# Patient Record
Sex: Female | Born: 1975 | Hispanic: Yes | Marital: Married | Smoking: Never smoker
Health system: Southern US, Community
[De-identification: ages and names within clinical notes are randomized; demographics above are authoritative.]

## PROBLEM LIST (undated history)

## (undated) DIAGNOSIS — E119 Type 2 diabetes mellitus without complications: Secondary | ICD-10-CM

## (undated) DIAGNOSIS — O09529 Supervision of elderly multigravida, unspecified trimester: Secondary | ICD-10-CM

## (undated) HISTORY — PX: NO PAST SURGERIES: SHX2092

---

## 2004-11-22 DIAGNOSIS — E119 Type 2 diabetes mellitus without complications: Secondary | ICD-10-CM

## 2004-11-22 HISTORY — DX: Type 2 diabetes mellitus without complications: E11.9

## 2015-11-23 DIAGNOSIS — O09529 Supervision of elderly multigravida, unspecified trimester: Secondary | ICD-10-CM

## 2015-11-23 HISTORY — DX: Supervision of elderly multigravida, unspecified trimester: O09.529

## 2015-11-23 NOTE — L&D Delivery Note (Signed)
Delivery Note EGA: 39.0 Patient's last menstrual period was 01/25/2016. EDC: 10/29/16  At 7:58 AM a viable female was delivered via Vaginal, Spontaneous Delivery (Presentation: cephalic; LOA).  APGAR: 8, 9; weight 7 lb 11.8 oz (3510 g).   Placenta status: sponatneous, intact.  Cord: 3 vessels, with the following complications: none apparent .  Cord pH: not collected  Anesthesia:  none Episiotomy: None Lacerations: None Suture Repair: n/a Est. Blood Loss (mL):  300cc  Mom to postpartum.  Baby to Couplet care / Skin to Skin.   Mom presented in labor and progressed from 2cm to 4cm.  She then rapidly progressed to complete with a bulging bag.  Amniotomy was performed for clear fluid; a short second stage with excellent maternal effort.  FEtal head was delivered then followed by shoulders and abdomen without difficulty.  Baby was then placed on mom's chest and attended to by pediatric team. After a delay until cord was pulseless (>60sec), FOB cut cord after doubly clamped. Placenta was delivered intact, and IV pitocin was started [facility].  No lacerations.  We sang happy birthday to baby Cammy Copabigail.    Mom signed consents for a PPTL and this will be scheduled later today.  Nas Wafer C Kaysin Brock 10/24/2016, 8:36 AM

## 2016-04-26 ENCOUNTER — Other Ambulatory Visit: Payer: Self-pay | Admitting: Primary Care

## 2016-04-26 DIAGNOSIS — O24419 Gestational diabetes mellitus in pregnancy, unspecified control: Secondary | ICD-10-CM

## 2016-06-14 ENCOUNTER — Ambulatory Visit
Admission: RE | Admit: 2016-06-14 | Discharge: 2016-06-14 | Disposition: A | Discharge status: Home / Self Care | Payer: Medicaid Other | Source: Ambulatory Visit | Attending: Obstetrics and Gynecology | Admitting: Obstetrics and Gynecology

## 2016-06-14 DIAGNOSIS — Z3A20 20 weeks gestation of pregnancy: Secondary | ICD-10-CM | POA: Insufficient documentation

## 2016-06-14 DIAGNOSIS — O24419 Gestational diabetes mellitus in pregnancy, unspecified control: Secondary | ICD-10-CM | POA: Diagnosis present

## 2016-06-14 DIAGNOSIS — O09522 Supervision of elderly multigravida, second trimester: Secondary | ICD-10-CM | POA: Diagnosis not present

## 2016-06-14 HISTORY — DX: Type 2 diabetes mellitus without complications: E11.9

## 2016-06-14 HISTORY — DX: Supervision of elderly multigravida, unspecified trimester: O09.529

## 2016-10-24 ENCOUNTER — Inpatient Hospital Stay: Payer: Medicaid Other | Admitting: Anesthesiology

## 2016-10-24 ENCOUNTER — Encounter
Admission: EM | Disposition: A | Discharge status: Home / Self Care | Payer: Self-pay | Source: Home / Self Care | Attending: Obstetrics & Gynecology

## 2016-10-24 ENCOUNTER — Inpatient Hospital Stay
Admission: EM | Admit: 2016-10-24 | Discharge: 2016-10-26 | DRG: 767 | Disposition: A | Discharge status: Home / Self Care | Payer: Medicaid Other | Attending: Obstetrics & Gynecology | Admitting: Obstetrics & Gynecology

## 2016-10-24 DIAGNOSIS — O99824 Streptococcus B carrier state complicating childbirth: Secondary | ICD-10-CM | POA: Diagnosis present

## 2016-10-24 DIAGNOSIS — Z3493 Encounter for supervision of normal pregnancy, unspecified, third trimester: Secondary | ICD-10-CM | POA: Diagnosis present

## 2016-10-24 DIAGNOSIS — E669 Obesity, unspecified: Secondary | ICD-10-CM | POA: Diagnosis present

## 2016-10-24 DIAGNOSIS — Z302 Encounter for sterilization: Secondary | ICD-10-CM

## 2016-10-24 DIAGNOSIS — O99214 Obesity complicating childbirth: Principal | ICD-10-CM | POA: Diagnosis present

## 2016-10-24 DIAGNOSIS — Z6831 Body mass index (BMI) 31.0-31.9, adult: Secondary | ICD-10-CM | POA: Diagnosis not present

## 2016-10-24 DIAGNOSIS — Z3A39 39 weeks gestation of pregnancy: Secondary | ICD-10-CM

## 2016-10-24 DIAGNOSIS — Z3490 Encounter for supervision of normal pregnancy, unspecified, unspecified trimester: Secondary | ICD-10-CM

## 2016-10-24 DIAGNOSIS — O09529 Supervision of elderly multigravida, unspecified trimester: Secondary | ICD-10-CM

## 2016-10-24 HISTORY — PX: TUBAL LIGATION: SHX77

## 2016-10-24 LAB — CBC
HEMATOCRIT: 35.4 % (ref 35.0–47.0)
HEMOGLOBIN: 12.4 g/dL (ref 12.0–16.0)
MCH: 32.2 pg (ref 26.0–34.0)
MCHC: 35.1 g/dL (ref 32.0–36.0)
MCV: 91.7 fL (ref 80.0–100.0)
Platelets: 218 10*3/uL (ref 150–440)
RBC: 3.87 MIL/uL (ref 3.80–5.20)
RDW: 14.2 % (ref 11.5–14.5)
WBC: 13.3 10*3/uL — AB (ref 3.6–11.0)

## 2016-10-24 LAB — TYPE AND SCREEN
ABO/RH(D): A POS
ANTIBODY SCREEN: NEGATIVE

## 2016-10-24 SURGERY — LIGATION, FALLOPIAN TUBE, POSTPARTUM
Anesthesia: General | Laterality: Bilateral

## 2016-10-24 MED ORDER — PENICILLIN G POTASSIUM 5000000 UNITS IJ SOLR
5.0000 10*6.[IU] | Freq: Once | INTRAVENOUS | Status: AC
  Administered 2016-10-24: 5 10*6.[IU] via INTRAVENOUS
  Filled 2016-10-24: qty 5

## 2016-10-24 MED ORDER — ACETAMINOPHEN 325 MG PO TABS: 650.0000 mg | ORAL_TABLET | ORAL | Status: DC | PRN

## 2016-10-24 MED ORDER — ONDANSETRON HCL 4 MG/2ML IJ SOLN
INTRAMUSCULAR | Status: DC | PRN
  Administered 2016-10-24: 4 mg via INTRAVENOUS

## 2016-10-24 MED ORDER — SOD CITRATE-CITRIC ACID 500-334 MG/5ML PO SOLN: 30.0000 mL | ORAL | Status: DC | PRN

## 2016-10-24 MED ORDER — WITCH HAZEL-GLYCERIN EX PADS: 1.0000 "application " | MEDICATED_PAD | CUTANEOUS | Status: DC | PRN

## 2016-10-24 MED ORDER — PENICILLIN G POT IN DEXTROSE 60000 UNIT/ML IV SOLN
3.0000 10*6.[IU] | INTRAVENOUS | Status: DC
  Filled 2016-10-24 (×11): qty 50

## 2016-10-24 MED ORDER — OXYTOCIN 10 UNIT/ML IJ SOLN
INTRAMUSCULAR | Status: AC
  Filled 2016-10-24: qty 2

## 2016-10-24 MED ORDER — ACETAMINOPHEN 500 MG PO TABS
1000.0000 mg | ORAL_TABLET | Freq: Four times a day (QID) | ORAL | Status: DC | PRN
  Administered 2016-10-25: 1000 mg via ORAL
  Filled 2016-10-24: qty 2

## 2016-10-24 MED ORDER — OXYCODONE-ACETAMINOPHEN 5-325 MG PO TABS: 2.0000 | ORAL_TABLET | ORAL | Status: DC | PRN

## 2016-10-24 MED ORDER — PROPOFOL 10 MG/ML IV BOLUS
INTRAVENOUS | Status: DC | PRN
  Administered 2016-10-24: 120 mg via INTRAVENOUS

## 2016-10-24 MED ORDER — METOCLOPRAMIDE HCL 10 MG PO TABS
10.0000 mg | ORAL_TABLET | Freq: Once | ORAL | Status: AC
  Administered 2016-10-24: 10 mg via ORAL
  Filled 2016-10-24: qty 1

## 2016-10-24 MED ORDER — FLEET ENEMA 7-19 GM/118ML RE ENEM: 1.0000 | ENEMA | RECTAL | Status: DC | PRN

## 2016-10-24 MED ORDER — SUCCINYLCHOLINE CHLORIDE 20 MG/ML IJ SOLN
INTRAMUSCULAR | Status: DC | PRN
  Administered 2016-10-24: 80 mg via INTRAVENOUS

## 2016-10-24 MED ORDER — FENTANYL CITRATE (PF) 100 MCG/2ML IJ SOLN
INTRAMUSCULAR | Status: DC | PRN
  Administered 2016-10-24: 50 ug via INTRAVENOUS

## 2016-10-24 MED ORDER — LACTATED RINGERS IV SOLN
INTRAVENOUS | Status: DC
  Administered 2016-10-24: 07:00:00 via INTRAVENOUS

## 2016-10-24 MED ORDER — LIDOCAINE HCL (CARDIAC) 20 MG/ML IV SOLN
INTRAVENOUS | Status: DC | PRN
  Administered 2016-10-24 (×2): 50 mg via INTRAVENOUS

## 2016-10-24 MED ORDER — MISOPROSTOL 200 MCG PO TABS
ORAL_TABLET | ORAL | Status: AC
  Filled 2016-10-24: qty 4

## 2016-10-24 MED ORDER — OXYTOCIN 40 UNITS IN LACTATED RINGERS INFUSION - SIMPLE MED
2.5000 [IU]/h | INTRAVENOUS | Status: DC
  Filled 2016-10-24: qty 1000

## 2016-10-24 MED ORDER — AMMONIA AROMATIC IN INHA
RESPIRATORY_TRACT | Status: AC
  Filled 2016-10-24: qty 10

## 2016-10-24 MED ORDER — TETANUS-DIPHTH-ACELL PERTUSSIS 5-2.5-18.5 LF-MCG/0.5 IM SUSP
0.5000 mL | Freq: Once | INTRAMUSCULAR | Status: DC
  Filled 2016-10-24: qty 0.5

## 2016-10-24 MED ORDER — FAMOTIDINE 20 MG PO TABS
40.0000 mg | ORAL_TABLET | Freq: Once | ORAL | Status: AC
  Administered 2016-10-24: 40 mg via ORAL
  Filled 2016-10-24: qty 2

## 2016-10-24 MED ORDER — BUTORPHANOL TARTRATE 1 MG/ML IJ SOLN
1.0000 mg | INTRAMUSCULAR | Status: DC | PRN
  Filled 2016-10-24: qty 1

## 2016-10-24 MED ORDER — ONDANSETRON HCL 4 MG/2ML IJ SOLN
4.0000 mg | Freq: Four times a day (QID) | INTRAMUSCULAR | Status: DC | PRN
  Administered 2016-10-24: 4 mg via INTRAVENOUS
  Filled 2016-10-24: qty 2

## 2016-10-24 MED ORDER — LIDOCAINE HCL (PF) 1 % IJ SOLN: 30.0000 mL | INTRAMUSCULAR | Status: DC | PRN

## 2016-10-24 MED ORDER — FENTANYL CITRATE (PF) 100 MCG/2ML IJ SOLN
25.0000 ug | INTRAMUSCULAR | Status: DC | PRN
  Administered 2016-10-24 (×2): 25 ug via INTRAVENOUS

## 2016-10-24 MED ORDER — BUPIVACAINE HCL 0.5 % IJ SOLN
INTRAMUSCULAR | Status: DC | PRN
  Administered 2016-10-24: 20 mL

## 2016-10-24 MED ORDER — OXYTOCIN BOLUS FROM INFUSION
500.0000 mL | Freq: Once | INTRAVENOUS | Status: AC
  Administered 2016-10-24: 500 mL via INTRAVENOUS

## 2016-10-24 MED ORDER — SUGAMMADEX SODIUM 200 MG/2ML IV SOLN
INTRAVENOUS | Status: DC | PRN
  Administered 2016-10-24: 100 mg via INTRAVENOUS

## 2016-10-24 MED ORDER — LIDOCAINE HCL (PF) 1 % IJ SOLN
INTRAMUSCULAR | Status: AC
  Filled 2016-10-24: qty 30

## 2016-10-24 MED ORDER — BUPIVACAINE HCL (PF) 0.5 % IJ SOLN
INTRAMUSCULAR | Status: AC
  Filled 2016-10-24: qty 30

## 2016-10-24 MED ORDER — IBUPROFEN 600 MG PO TABS
600.0000 mg | ORAL_TABLET | Freq: Four times a day (QID) | ORAL | Status: DC
  Administered 2016-10-24 – 2016-10-26 (×9): 600 mg via ORAL
  Filled 2016-10-24 (×8): qty 1

## 2016-10-24 MED ORDER — LACTATED RINGERS IV SOLN: 500.0000 mL | INTRAVENOUS | Status: DC | PRN

## 2016-10-24 MED ORDER — ROCURONIUM BROMIDE 100 MG/10ML IV SOLN
INTRAVENOUS | Status: DC | PRN
  Administered 2016-10-24: 15 mg via INTRAVENOUS

## 2016-10-24 MED ORDER — SIMETHICONE 80 MG PO CHEW: 80.0000 mg | CHEWABLE_TABLET | ORAL | Status: DC | PRN

## 2016-10-24 MED ORDER — ONDANSETRON HCL 4 MG PO TABS: 4.0000 mg | ORAL_TABLET | ORAL | Status: DC | PRN

## 2016-10-24 MED ORDER — DIPHENHYDRAMINE HCL 25 MG PO CAPS: 25.0000 mg | ORAL_CAPSULE | Freq: Four times a day (QID) | ORAL | Status: DC | PRN

## 2016-10-24 MED ORDER — COCONUT OIL OIL: 1.0000 "application " | TOPICAL_OIL | Status: DC | PRN

## 2016-10-24 MED ORDER — LACTATED RINGERS IV SOLN: INTRAVENOUS | Status: DC

## 2016-10-24 MED ORDER — PRENATAL MULTIVITAMIN CH
1.0000 | ORAL_TABLET | Freq: Every day | ORAL | Status: DC
  Administered 2016-10-25 – 2016-10-26 (×2): 1 via ORAL
  Filled 2016-10-24 (×3): qty 1

## 2016-10-24 MED ORDER — DIBUCAINE 1 % RE OINT
1.0000 | TOPICAL_OINTMENT | RECTAL | Status: DC | PRN
Start: 2016-10-24 — End: 2016-10-26

## 2016-10-24 MED ORDER — ONDANSETRON HCL 4 MG/2ML IJ SOLN: 4.0000 mg | INTRAMUSCULAR | Status: DC | PRN

## 2016-10-24 MED ORDER — DOCUSATE SODIUM 100 MG PO CAPS
100.0000 mg | ORAL_CAPSULE | Freq: Two times a day (BID) | ORAL | Status: DC
  Administered 2016-10-24 – 2016-10-26 (×4): 100 mg via ORAL
  Filled 2016-10-24 (×4): qty 1

## 2016-10-24 MED ORDER — LACTATED RINGERS IV SOLN
INTRAVENOUS | Status: DC | PRN
  Administered 2016-10-24: 15:00:00 via INTRAVENOUS

## 2016-10-24 MED ORDER — OXYCODONE-ACETAMINOPHEN 5-325 MG PO TABS
1.0000 | ORAL_TABLET | ORAL | Status: DC | PRN
  Administered 2016-10-24: 1 via ORAL
  Filled 2016-10-24: qty 1

## 2016-10-24 MED ORDER — ONDANSETRON HCL 4 MG/2ML IJ SOLN: 4.0000 mg | Freq: Once | INTRAMUSCULAR | Status: DC | PRN

## 2016-10-24 MED ORDER — FENTANYL CITRATE (PF) 100 MCG/2ML IJ SOLN
INTRAMUSCULAR | Status: AC
  Administered 2016-10-24: 25 ug via INTRAVENOUS
  Filled 2016-10-24: qty 2

## 2016-10-24 SURGICAL SUPPLY — 31 items
BLADE SURG SZ11 CARB STEEL (BLADE) ×3 IMPLANT
CANISTER SUCT 1200ML W/VALVE (MISCELLANEOUS) ×3 IMPLANT
CHLORAPREP W/TINT 26ML (MISCELLANEOUS) ×3 IMPLANT
DERMABOND ADVANCED (GAUZE/BANDAGES/DRESSINGS) ×2
DERMABOND ADVANCED .7 DNX12 (GAUZE/BANDAGES/DRESSINGS) ×1 IMPLANT
DRAPE LAPAROTOMY 100X77 ABD (DRAPES) ×3 IMPLANT
ELECT REM PT RETURN 9FT ADLT (ELECTROSURGICAL) ×3
ELECTRODE REM PT RTRN 9FT ADLT (ELECTROSURGICAL) ×1 IMPLANT
GLOVE BIO SURGEON STRL SZ7 (GLOVE) ×3 IMPLANT
GLOVE INDICATOR 7.5 STRL GRN (GLOVE) ×3 IMPLANT
GOWN STRL REUS W/ TWL LRG LVL3 (GOWN DISPOSABLE) ×1 IMPLANT
GOWN STRL REUS W/TWL LRG LVL3 (GOWN DISPOSABLE) ×2
KIT RM TURNOVER CYSTO AR (KITS) ×3 IMPLANT
LABEL OR SOLS (LABEL) ×3 IMPLANT
LIGASURE IMPACT 36 18CM CVD LR (INSTRUMENTS) IMPLANT
LIQUID BAND (GAUZE/BANDAGES/DRESSINGS) ×3 IMPLANT
NDL SAFETY 22GX1.5 (NEEDLE) ×3 IMPLANT
NS IRRIG 500ML POUR BTL (IV SOLUTION) ×3 IMPLANT
PACK BASIN MINOR ARMC (MISCELLANEOUS) ×3 IMPLANT
PAD OB MATERNITY 4.3X12.25 (PERSONAL CARE ITEMS) ×3 IMPLANT
RETRACTOR WOUND ALXS 18CM SML (MISCELLANEOUS) ×1 IMPLANT
RTRCTR WOUND ALEXIS O 18CM SML (MISCELLANEOUS) ×3
SPONGE LAP 4X18 5PK (MISCELLANEOUS) ×3 IMPLANT
SUT CHROMIC GUT BROWN 0 54 (SUTURE) ×1 IMPLANT
SUT CHROMIC GUT BROWN 0 54IN (SUTURE) ×3
SUT MNCRL 4-0 (SUTURE) ×2
SUT MNCRL 4-0 27XMFL (SUTURE) ×1
SUT VIC AB 0 CT2 27 (SUTURE) ×3 IMPLANT
SUT VICRYL 0 AB UR-6 (SUTURE) ×6 IMPLANT
SUTURE MNCRL 4-0 27XMF (SUTURE) ×1 IMPLANT
SYRINGE 10CC LL (SYRINGE) ×3 IMPLANT

## 2016-10-24 NOTE — Discharge Summary (Signed)
Obstetrical Discharge Summary  Patient Name: Beth Harris DOB: 12-Jan-1976 MRN: [id number]  Date of Admission: 10/24/2016 Date of Discharge: 10/26/16 [facility]: [facility]   Gestational Age at Delivery: 7563w0d   Antepartum complications: AMA, obesity, history of depression Admitting Diagnosis: labor Secondary Diagnosis: Patient Active Problem List   Diagnosis Date Noted  . Pregnancy 10/24/2016    Augmentation: none Complications: None Intrapartum complications/course: Mom presented in labor and progressed from 2cm to 4cm.  She then rapidly progressed to complete with a bulging bag.  Amniotomy was performed for clear fluid; a short second stage with excellent maternal effort.  Fetal head was delivered then followed by shoulders and abdomen without difficulty.  Baby was then placed on mom's chest and attended to by pediatric team. After a delay until cord was pulseless (>60sec), FOB cut cord after doubly clamped. Placenta was delivered intact, and IV pitocin was started for [facility] prophylaxis.  No lacerations.  Date of Delivery:  10/24/16 Delivered By: Leeroy Bockhelsea Ward Delivery Type: spontaneous vaginal delivery Anesthesia: none Placenta: sponatneous Laceration: none Episiotomy: none Newborn Data: Live born female  Birth Weight: 7 lb 11.8 oz (3510 g) APGAR: 8, 9   Postpartum Procedures: Postpartum BTL on 10/24/16, uncomplicated  Post partum course:  Patient had an uncomplicated postpartum course.  By time of discharge on PPD#2, her pain was controlled on oral pain medications; she had appropriate lochia and was ambulating, voiding without difficulty and tolerating regular diet.  She was deemed stable for discharge to home.     Discharge Physical Exam: 10/26/16 BP 125/72   Pulse 85   Temp 98.4 F (36.9 C) (Oral)   Resp 16   LMP 01/25/2016   General: NAD CV: RRR Pulm: CTABL, nl effort ABD: s/nd/nt, fundus firm and below the umbilicus, incision is c/d/i, no significant erythema or  edema  Lochia: moderate DVT Evaluation: LE non-ttp, no evidence of DVT on exam.  Hemoglobin  Date Value Ref Range Status  10/24/2016 12.4 12.0 - 16.0 g/dL Final   HCT  Date Value Ref Range Status  10/24/2016 35.4 35.0 - 47.0 % Final     Disposition: stable, discharge to home. Baby Feeding: breast milk and formula Baby Disposition: home with mom  Rh Immune globulin given: n/a Rubella vaccine given: n/a Tdap vaccine given in AP or PP setting: AP Flu vaccine given in AP or PP setting: AP  Contraception: BTL  Prenatal Labs:  Blood type/Rh A pos  Antibody screen neg  Rubella Immune  Varicella Immune  RPR NR  HBsAg Neg  HIV NR  GC neg  Chlamydia neg  Genetic screening negative  1 hour GTT 125  3 hour GTT Early - 83/149/125/115  GBS positive      Plan:  Beth Harris was discharged to home in good condition. Follow-up Information    [facility] 6 week(s).   Specialty:  General Practice Why:  for routine post partum care Contact information: [address] [place] [address] [phone]        Christeen DouglasBEASLEY, BETHANY, MD Follow up in 2 week(s).   Specialty:  Obstetrics and Gynecology Why:  Post-op appointment for BTL  Contact information: [address] [place] [address] [phone]           Discharge Medications:   Medication List    TAKE these medications   HYDROcodone-acetaminophen 5-325 MG tablet Commonly known as:  NORCO/VICODIN Take 1 tablet by mouth every 4 (four) hours as needed  for moderate pain.   ibuprofen 600 MG tablet Commonly known as:  ADVIL,MOTRIN Take 1 tablet (600 mg total) by mouth every 6 (six) hours.   prenatal multivitamin Tabs tablet Take 1 tablet by mouth daily at 12 noon.         Signed: Carlean JewsMeredith Leanah Kolander, CNM  [facility]

## 2016-10-24 NOTE — Discharge Instructions (Signed)

## 2016-10-24 NOTE — Op Note (Addendum)
Beth Harris 10/24/2016  PREOPERATIVE DIAGNOSES: Multiparity, undesired fertility  POSTOPERATIVE DIAGNOSES: Multiparity, undesired fertility  PROCEDURE:  Postpartum Bilateral Tubal Sterilization by bilateral partial salpingectomy, including fimbriae  SURGEON: Dr. Christeen DouglasBethany Peterson Mathey  ANESTHESIA:   GETA and local analgesia using 20 ml of 0.5% Marcaine  Anesthesiologist: Yves DillPaul Carroll, MD Anesthesiologist: Yves DillPaul Carroll, MD CRNA: Eduardo Osieravid Kilduff, CRNA  COMPLICATIONS:  None immediate.  ESTIMATED BLOOD LOSS: minimal.  FLUIDS: 200 ml LR.  URINE OUTPUT:  Not assessed  INDICATIONS:  [age] y.o. [id number] with undesired fertility,status post vaginal delivery, desires permanent sterilization.  Other reversible forms of contraception were discussed with patient; she declines all other modalities. Risks of procedure discussed with patient including but not limited to: risk of regret, permanence of method, bleeding, infection, injury to surrounding organs and need for additional procedures.  Failure risk of 1 -2 % with increased risk of ectopic gestation if pregnancy occurs was also discussed with patient.      FINDINGS:  Normal uterus, tubes, and ovaries.  PROCEDURE DETAILS:  The patient was taken to the operating room where GETA was administered. She was then placed in the dorsal supine position and prepped and draped in sterile fashion. After an adequate timeout was performed, attention was turned to the patient's abdomen where a small transverse skin incision was made under the umbilical fold. The incision was taken down to the layer of fascia using the scalpel, and fascia was incised, and extended bilaterally using Mayo scissors. The peritoneum was entered in a sharp fashion. Attention was then turned to the patient's uterus, and left fallopian tube was identified and followed out to the fimbriated end. Using a Ligasure bipolar cautery device, a portion of the left tube including the fimbriated end was  cauterized and sharply excised. A similar process was carried out on the right side allowing for bilateral tubal sterilization. Good hemostasis was noted overall. Local analgesia was poured over both adenexa.The instruments were then removed from the patient's abdomen and the fascial incision was repaired with 0 Vicryl, and the skin was closed with a 4-0 Vicryl subcuticular stitch. The patient tolerated the procedure well. Instrument, sponge, and needle counts were correct times two. The patient was then taken to the recovery room awake and in stable condition.

## 2016-10-24 NOTE — Progress Notes (Signed)
Pt to or for BTL in Bed repot given to ArvinMeritorSharron RN in OR

## 2016-10-24 NOTE — Anesthesia Procedure Notes (Signed)
Procedure Name: Intubation Date/Time: 10/24/2016 2:38 PM Performed by: ZOXWRUEKILDUFF, Seleen Walter Pre-anesthesia Checklist: Patient identified, Emergency Drugs available, Suction available, Patient being monitored and Timeout performed Patient Re-evaluated:Patient Re-evaluated prior to inductionOxygen Delivery Method: Circle system utilized Preoxygenation: Pre-oxygenation with 100% oxygen Intubation Type: IV induction Laryngoscope Size: Mac and 3 Grade View: Grade I Tube type: Oral Tube size: 7.0 mm Number of attempts: 1 Secured at: 21 cm Tube secured with: Tape

## 2016-10-24 NOTE — Transfer of Care (Signed)
Immediate Anesthesia Transfer of Care Note  Patient: Beth Harris  Procedure(s) Performed: Procedure(s): POST PARTUM TUBAL LIGATION (Bilateral)  Patient Location: PACU  Anesthesia Type:General  Level of Consciousness: awake and alert   Airway & Oxygen Therapy: Patient Spontanous Breathing  Post-op Assessment: Report given to RN  Post vital signs: Reviewed and stable  Last Vitals:  Vitals:   10/24/16 1205 10/24/16 1517  BP: (!) 106/55 114/72  Pulse: 73 88  Resp: 18 18  Temp: 36.4 C 37 C    Last Pain:  Vitals:   10/24/16 1205  TempSrc: Oral  PainSc:          Complications: No apparent anesthesia complications

## 2016-10-24 NOTE — Interval H&P Note (Signed)
History and Physical Interval Note:  10/24/2016 11:28 AM  Beth Harris  has presented today for surgery, with the diagnosis of DESIRED PERMANENT STERILIZATION  The various methods of treatment have been discussed with the patient and family. After consideration of risks, benefits and other options for treatment, the patient has consented to  Procedure(s): POST PARTUM TUBAL LIGATION (Bilateral) as a surgical intervention .  The patient's history has been reviewed, patient examined, no change in status, stable for surgery.  I have reviewed the patient's chart and labs.  Questions were answered to the patient's satisfaction.     Christeen DouglasBEASLEY, Elaisha Zahniser

## 2016-10-24 NOTE — Anesthesia Preprocedure Evaluation (Addendum)
Anesthesia Evaluation  Patient identified by MRN, date of birth, ID band Patient awake    Airway Mallampati: II  TM Distance: >3 FB     Dental  (+) Chipped   Pulmonary neg pulmonary ROS,    Pulmonary exam normal        Cardiovascular negative cardio ROS Normal cardiovascular exam     Neuro/Psych negative neurological ROS  negative psych ROS   GI/Hepatic negative GI ROS,   Endo/Other  diabetes, Gestational  Renal/GU negative Renal ROS  negative genitourinary   Musculoskeletal negative musculoskeletal ROS (+)   Abdominal Normal abdominal exam  (+)   Peds negative pediatric ROS (+)  Hematology negative hematology ROS (+)   Anesthesia Other Findings   Reproductive/Obstetrics                            Anesthesia Physical Anesthesia Plan  ASA: II and emergent  Anesthesia Plan: General   Post-op Pain Management:    Induction: Intravenous, Rapid sequence and Cricoid pressure planned  Airway Management Planned: Oral ETT  Additional Equipment:   Intra-op Plan:   Post-operative Plan: Extubation in OR  Informed Consent: I have reviewed the patients History and Physical, chart, labs and discussed the procedure including the risks, benefits and alternatives for the proposed anesthesia with the patient or authorized representative who has indicated his/her understanding and acceptance.   Dental advisory given  Plan Discussed with: CRNA and Surgeon  Anesthesia Plan Comments:         Anesthesia Quick Evaluation

## 2016-10-24 NOTE — H&P (Signed)
OB History & Physical   History of Present Illness:  Chief Complaint:   HPI:  Beth Harris is a 40 y.o. G3P2002 female at 6818w0d dated by LMP with EDC of 10/31/16.  She presents to L&D with worsening contractions, and cervical dilation of 2cm.  In the next hours she made change to 4cm  +FM, + CTX, no LOF, no VB  Pregnancy Issues: 1. Obesity 2. AMA 3. History of depression 4. Desires permanent sterilization  Maternal Medical History:   Past Medical History:  Diagnosis Date  . AMA (advanced maternal age) multigravida 35+ 2017  . Diabetes mellitus without complication (HCC) 2006   Gestational Diabetes    Past Surgical History:  Procedure Laterality Date  . NO PAST SURGERIES      No Known Allergies  Prior to Admission medications   Medication Sig Start Date End Date Taking? Authorizing Provider  Prenatal Vit-Fe Fumarate-FA (PRENATAL MULTIVITAMIN) TABS tablet Take 1 tablet by mouth daily at 12 noon.   Yes Historical Provider, MD     [facility]  Social History: She  reports that she has never smoked. She has never used smokeless tobacco. She reports that she does not drink alcohol or use drugs.  Family History: family history is not on file.   Review of Systems: A full review of systems was performed and negative except as noted in the HPI.     Physical Exam:  Vital Signs: BP 117/81   Pulse 75   Temp 97.8 F (36.6 C) (Oral)   Resp 18   LMP 01/25/2016  General: no acute distress.  HEENT: normocephalic, atraumatic Heart: regular rate & rhythm.  No murmurs/rubs/gallops Lungs: clear to auscultation bilaterally, normal respiratory effort Abdomen: soft, gravid, non-tender;  EFW: 6.5 Pelvic:   External: Normal external female genitalia  Cervix: 4/80/-1   Extremities: non-tender, symmetric, 1+ edema bilaterally.  DTRs: 2+ Neurologic: Alert & oriented x 3.    Results for orders placed or performed during the hospital encounter of 10/24/16 (from  the past 24 hour(s))  CBC     Status: Abnormal   Collection Time: 10/24/16  7:17 AM  Result Value Ref Range   WBC 13.3 (H) 3.6 - 11.0 K/uL   RBC 3.87 3.80 - 5.20 MIL/uL   Hemoglobin 12.4 12.0 - 16.0 g/dL   HCT 40.935.4 81.135.0 - 91.447.0 %   MCV 91.7 80.0 - 100.0 fL   MCH 32.2 26.0 - 34.0 pg   MCHC 35.1 32.0 - 36.0 g/dL   RDW 78.214.2 95.611.5 - 21.314.5 %   Platelets 218 150 - 440 K/uL  Type and screen [facility]     Status: None (Preliminary result)   Collection Time: 10/24/16  7:20 AM  Result Value Ref Range   ABO/RH(D) PENDING    Antibody Screen PENDING    Sample Expiration 10/27/2016     Pertinent Results:  Prenatal Labs: Blood type/Rh A pos  Antibody screen neg  Rubella Immune  Varicella Immune  RPR NR  HBsAg Neg  HIV NR  GC neg  Chlamydia neg  Genetic screening negative  1 hour GTT 125  3 hour GTT Early - 83/149/125/115  GBS positive   FHT: 135 mod + accels no decels TOCO: q2-6 min SVE:  4/80/-1   Cephalic by leopolds  Assessment:  Beth Harris is a 40 y.o. 73P2002 female at 3418w0d with labor.   Plan:  1. Admit to Labor & Delivery 2. CBC, T&S, Clrs, IVF  3. GBS positive -- PCN 79M units/ then 52M q4h until delivery  4. Consents obtained. 5. Continuous efm/toco 6. Expectant management  7. Epidural if desired  ----- Ranae Plumberhelsea Omarii Scalzo, MD Attending Obstetrician and Gynecologist [facility], Department of OB/GYN [facility]

## 2016-10-25 ENCOUNTER — Encounter: Payer: Self-pay | Admitting: Obstetrics and Gynecology

## 2016-10-25 LAB — CBC
HCT: 33.4 % — ABNORMAL LOW (ref 35.0–47.0)
Hemoglobin: 11.5 g/dL — ABNORMAL LOW (ref 12.0–16.0)
MCH: 32 pg (ref 26.0–34.0)
MCHC: 34.4 g/dL (ref 32.0–36.0)
MCV: 93.2 fL (ref 80.0–100.0)
Platelets: 190 K/uL (ref 150–440)
RBC: 3.59 MIL/uL — ABNORMAL LOW (ref 3.80–5.20)
RDW: 14.4 % (ref 11.5–14.5)
WBC: 11.2 K/uL — ABNORMAL HIGH (ref 3.6–11.0)

## 2016-10-25 LAB — RPR: RPR: NONREACTIVE

## 2016-10-25 MED ORDER — HYDROCODONE-ACETAMINOPHEN 5-325 MG PO TABS
1.0000 | ORAL_TABLET | ORAL | Status: DC | PRN
  Administered 2016-10-25 – 2016-10-26 (×3): 1 via ORAL
  Filled 2016-10-25 (×4): qty 1

## 2016-10-25 NOTE — Progress Notes (Signed)
Post Partum Day 1 Subjective: incisional pain   Objective: Blood pressure (!) 109/59, pulse 79, temperature 98 F (36.7 C), temperature source Oral, resp. rate 18, last menstrual period 01/25/2016, SpO2 98 %, unknown if currently breastfeeding.  Physical Exam:  General: alert and cooperative Lochia: appropriate Uterine Fundus: firm Incision: healing well DVT Evaluation: No evidence of DVT seen on physical exam.   Recent Labs  10/24/16 0717 10/25/16 0551  HGB 12.4 11.5*  HCT 35.4 33.4*    Assessment/Plan: Plan for discharge tomorrow  Add norco prn pain    LOS: 1 day   Beth Harris 10/25/2016, 9:38 AM

## 2016-10-25 NOTE — Anesthesia Postprocedure Evaluation (Signed)
Anesthesia Post Note  Patient: Beth Harris  Procedure(s) Performed: Procedure(s) (LRB): POST PARTUM TUBAL LIGATION (Bilateral)  Patient location during evaluation: PACU Anesthesia Type: General Level of consciousness: awake and alert and oriented Pain management: pain level controlled Vital Signs Assessment: post-procedure vital signs reviewed and stable Respiratory status: spontaneous breathing Cardiovascular status: blood pressure returned to baseline Anesthetic complications: no    Last Vitals:  Vitals:   10/24/16 1900 10/24/16 2300  BP: (!) 104/50 110/62  Pulse: 76 72  Resp: 18 18  Temp: 36.9 C 36.9 C    Last Pain:  Vitals:   10/24/16 2300  TempSrc: Oral  PainSc:                  Collins Dimaria

## 2016-10-26 LAB — SURGICAL PATHOLOGY

## 2016-10-26 MED ORDER — IBUPROFEN 600 MG PO TABS: 600.0000 mg | ORAL_TABLET | Freq: Four times a day (QID) | ORAL | 0 refills | Status: AC

## 2016-10-26 MED ORDER — HYDROCODONE-ACETAMINOPHEN 5-325 MG PO TABS: 1.0000 | ORAL_TABLET | ORAL | 0 refills | Status: AC | PRN

## 2016-10-26 NOTE — Progress Notes (Signed)
Patient discharged home with infant and spouse. Discharge instructions, prescriptions and follow up appointment given to and reviewed with patient and spouse. Patient verbalized understanding. Escorted out via wheelchair.  

## 2018-05-04 IMAGING — US US MFM OB DETAIL+14 WK
1 series · 14 of 28 positions shown · non-contrast
Comparison: none

[Series 1: us mfm ob detail+14 wk · 0.25mm/px · 14 of 53 slices shown]
[im 2/53]
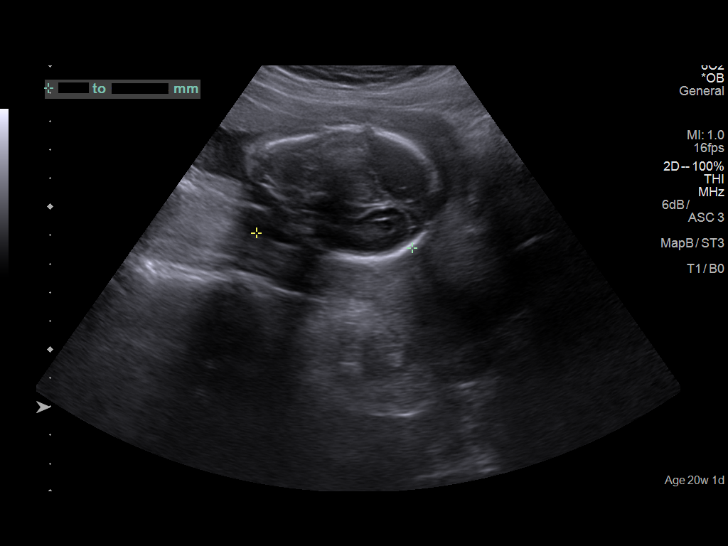
[im 6/53]
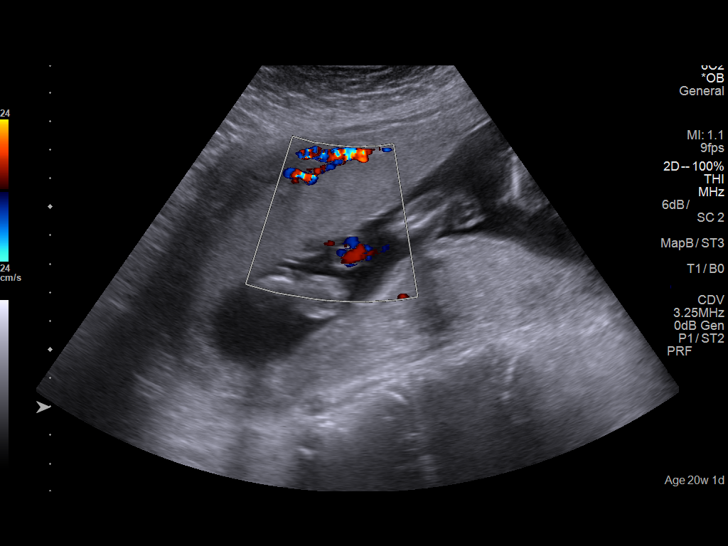
[im 10/53]
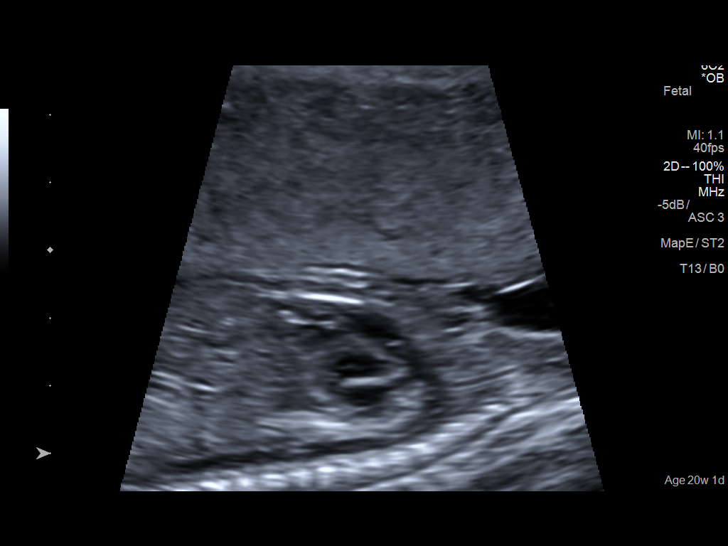
[im 14/53]
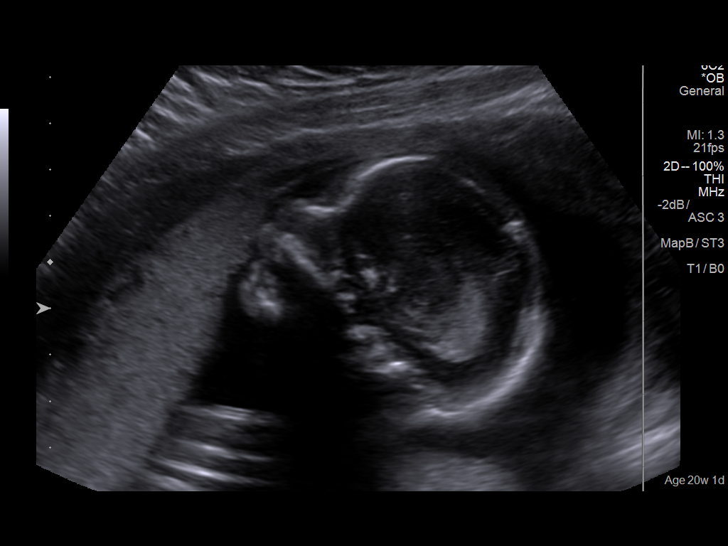
[im 18/53]
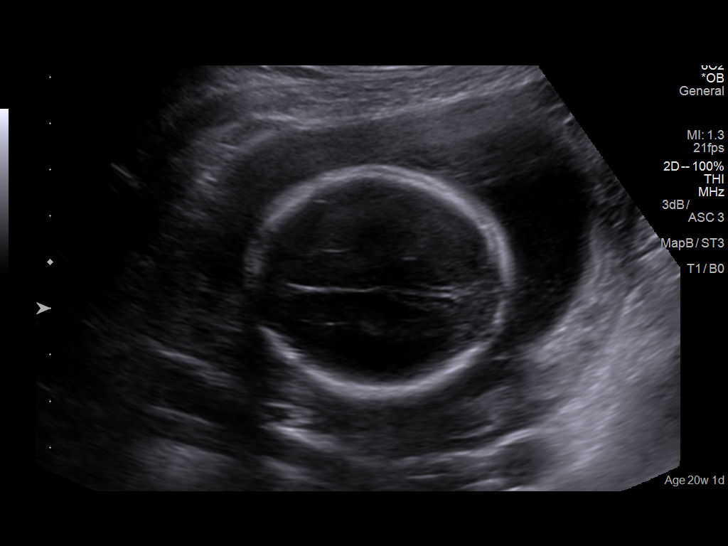
[im 22/53]
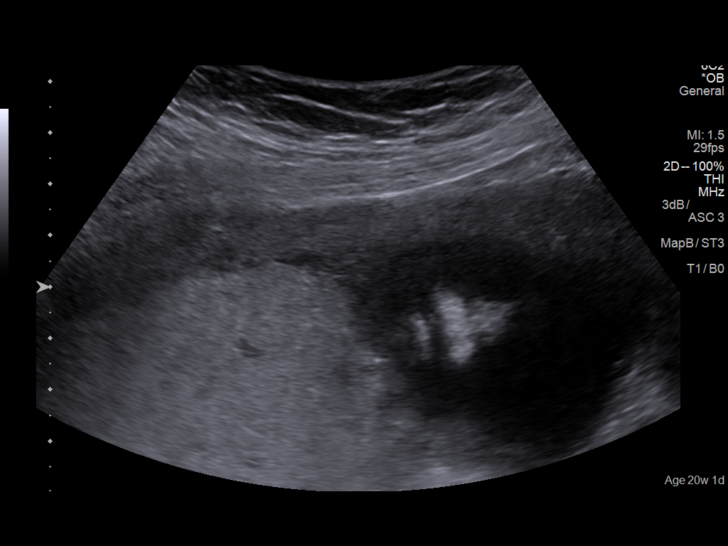
[im 26/53]
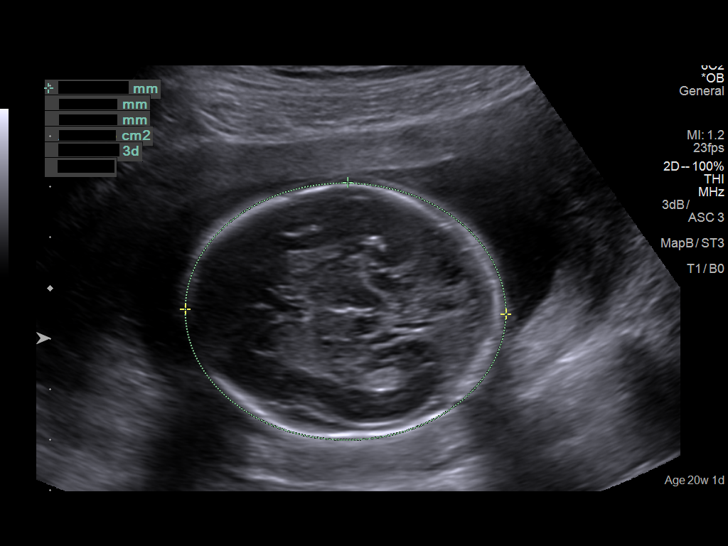
[im 29/53]
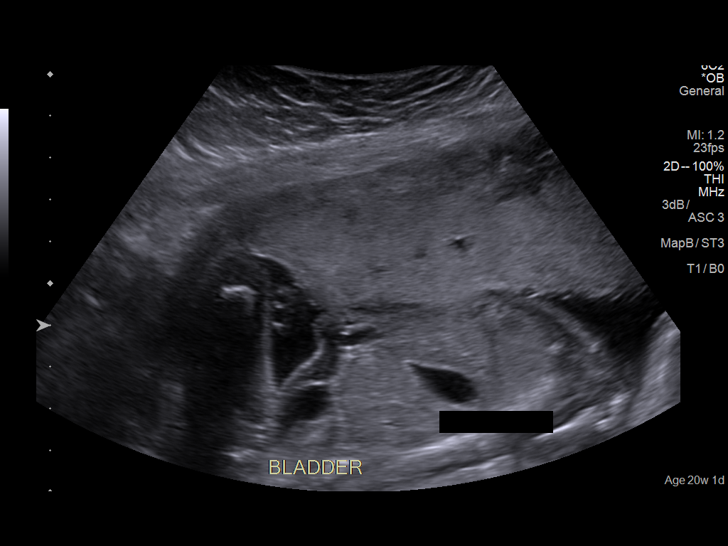
[im 33/53]
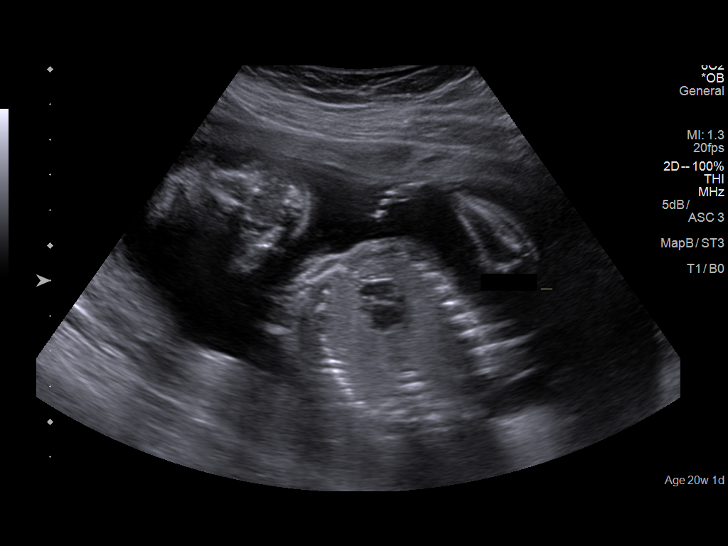
[im 37/53]
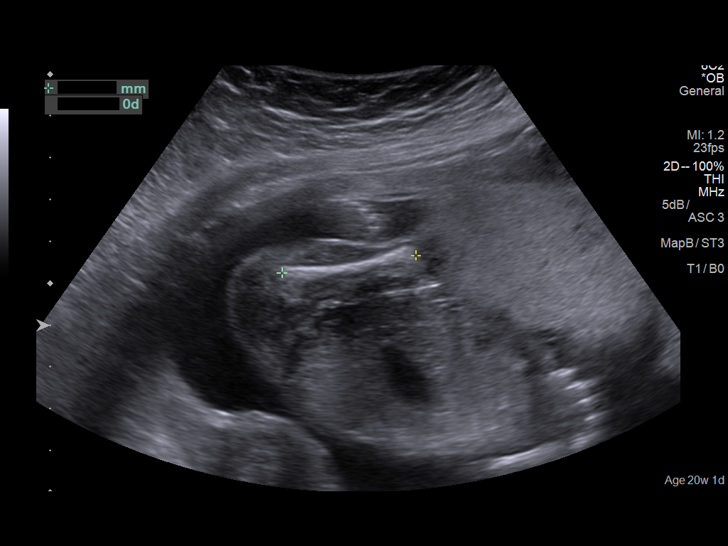
[im 41/53]
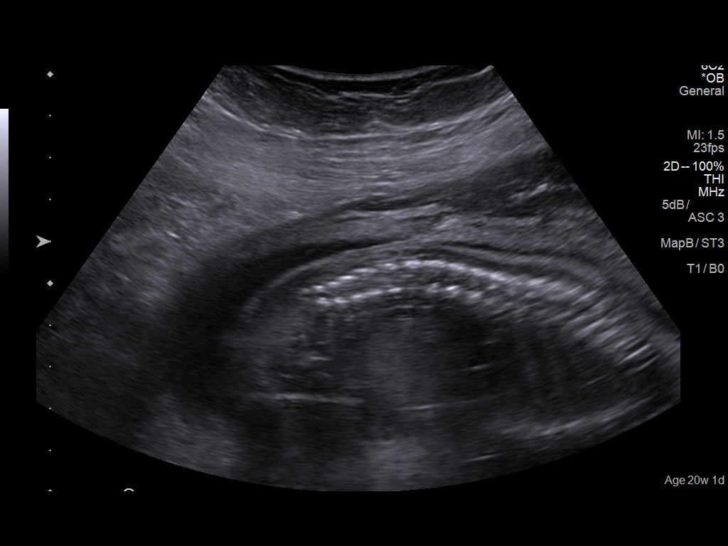
[im 45/53]
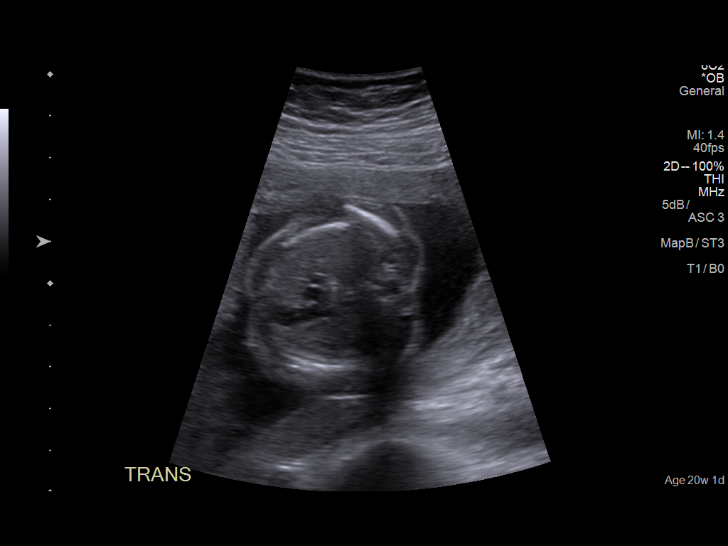
[im 49/53]
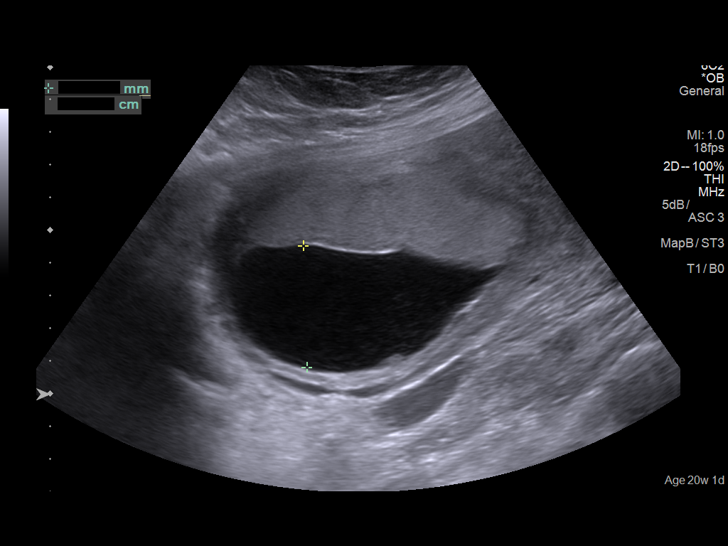
[im 53/53]
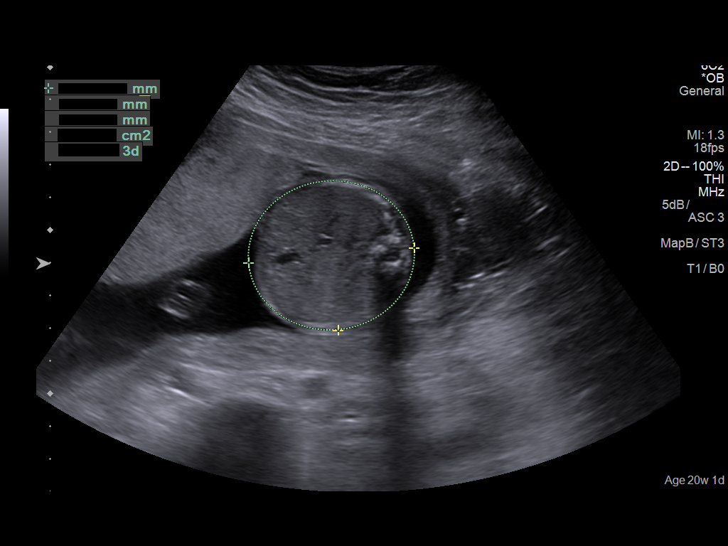

[14 of 28 positions shown; findings below may reference images not displayed]

Canned report from images found in remote index.

Refer to host system for actual result text.

## 2024-09-30 ENCOUNTER — Encounter (HOSPITAL_COMMUNITY): Payer: Self-pay | Admitting: *Deleted

## 2024-09-30 ENCOUNTER — Other Ambulatory Visit: Payer: Self-pay

## 2024-09-30 ENCOUNTER — Ambulatory Visit (HOSPITAL_COMMUNITY)
Admission: EM | Admit: 2024-09-30 | Discharge: 2024-09-30 | Disposition: A | Discharge status: Home / Self Care | Payer: Self-pay | Attending: Family Medicine | Admitting: Family Medicine

## 2024-09-30 DIAGNOSIS — N3001 Acute cystitis with hematuria: Secondary | ICD-10-CM | POA: Insufficient documentation

## 2024-09-30 DIAGNOSIS — Z3202 Encounter for pregnancy test, result negative: Secondary | ICD-10-CM

## 2024-09-30 LAB — POCT URINALYSIS DIP (MANUAL ENTRY)
Bilirubin, UA: NEGATIVE
Glucose, UA: NEGATIVE mg/dL
Ketones, POC UA: NEGATIVE mg/dL
Nitrite, UA: NEGATIVE
Protein Ur, POC: NEGATIVE mg/dL
Spec Grav, UA: 1.01 (ref 1.010–1.025)
Urobilinogen, UA: 0.2 U/dL
pH, UA: 5.5 (ref 5.0–8.0)

## 2024-09-30 LAB — POCT URINE PREGNANCY: Preg Test, Ur: NEGATIVE

## 2024-09-30 MED ORDER — CEPHALEXIN 500 MG PO CAPS: 500.0000 mg | ORAL_CAPSULE | Freq: Two times a day (BID) | ORAL | 0 refills | Status: AC

## 2024-09-30 NOTE — ED Provider Notes (Signed)
 [facility]    CSN: [id number] Arrival date & time: 09/30/24  1053      History   Chief Complaint Chief Complaint  Patient presents with   Dysuria    HPI Beth Harris is a 48 y.o. female.   Beth Harris is a 48 y.o. female presenting for chief complaint of Dysuria, suprapubic pressure, low back pain, headaches, and urinary urgency that started 5 to 6 days ago.  She additionally reports sensation of incomplete bladder emptying with each void.  History of UTI in the remote past, states this feels very similar.  She denies upper abdominal pain, nausea, vomiting, diarrhea, gross hematuria, fever/chills, dizziness, flank pain, and recent antibiotic or steroid use.  She denies frequent intake of urinary irritants and use of SGLT2 inhibitor.  Denies vaginal symptoms.  Last menstrual cycle started September 03, 2024, denies chance of pregnancy, history of tubal ligation.  She has not attempted use of any over-the-counter medications to help with symptoms PTA.   Dysuria   Past Medical History:  Diagnosis Date   AMA (advanced maternal age) multigravida 35+ 2017   Diabetes mellitus without complication (HCC) 2006   Gestational Diabetes    Patient Active Problem List   Diagnosis Date Noted   Pregnancy 10/24/2016   Advanced maternal age in multigravida 10/24/2016   Labor and delivery, indication for care 10/24/2016   Normal vaginal delivery 10/24/2016    Past Surgical History:  Procedure Laterality Date   NO PAST SURGERIES     TUBAL LIGATION Bilateral 10/24/2016   Procedure: POST PARTUM TUBAL LIGATION;  Surgeon: Heather Penton, MD;  Location: [facility];  Service: Gynecology;  Laterality: Bilateral;    OB History     Gravida  3   Para  2   Term  2   Preterm      AB      Living  2      SAB      IAB      Ectopic      Multiple      Live Births  2            Home Medications    Prior to Admission medications   Medication Sig Start Date End Date  Taking? Authorizing Provider  cephALEXin (KEFLEX) 500 MG capsule Take 1 capsule (500 mg total) by mouth 2 (two) times daily for 7 days. 09/30/24 10/07/24 Yes StanhopeDorna HERO, FNP  HYDROcodone -acetaminophen  (NORCO/VICODIN) 5-325 MG tablet Take 1 tablet by mouth every 4 (four) hours as needed for moderate pain. 10/26/16   Sigmon, Hoy BROCKS, CNM  ibuprofen  (ADVIL ,MOTRIN ) 600 MG tablet Take 1 tablet (600 mg total) by mouth every 6 (six) hours. 10/26/16   Elner Hoy BROCKS, CNM  Prenatal Vit-Fe Fumarate-FA (PRENATAL MULTIVITAMIN) TABS tablet Take 1 tablet by mouth daily at 12 noon.    [provider]    Family History History reviewed. No pertinent family history.  Social History Social History   Tobacco Use   Smoking status: Never   Smokeless tobacco: Never  Substance Use Topics   Alcohol use: No   Drug use: No     Allergies   Patient has no known allergies.   Review of Systems Review of Systems  Genitourinary:  Positive for dysuria.  Per HPI   Physical Exam Triage Vital Signs ED Triage Vitals  Encounter Vitals Group     BP 09/30/24 1155 131/78     Girls Systolic BP Percentile --  Girls Diastolic BP Percentile --      Boys Systolic BP Percentile --      Boys Diastolic BP Percentile --      Pulse Rate 09/30/24 1155 83     Resp 09/30/24 1155 18     Temp 09/30/24 1155 98.3 F (36.8 C)     Temp src --      SpO2 09/30/24 1155 99 %     Weight --      Height --      Head Circumference --      Peak Flow --      Pain Score 09/30/24 1152 8     Pain Loc --      Pain Education --      Exclude from Growth Chart --    No data found.  Updated Vital Signs BP 131/78   Pulse 83   Temp 98.3 F (36.8 C)   Resp 18   LMP 09/03/2024 (Approximate)   SpO2 99%   Breastfeeding No   Visual Acuity Right Eye Distance:   Left Eye Distance:   Bilateral Distance:    Right Eye Near:   Left Eye Near:    Bilateral Near:     Physical Exam Vitals and nursing note  reviewed.  Constitutional:      Appearance: She is not ill-appearing or toxic-appearing.  HENT:     Head: Normocephalic and atraumatic.     Right Ear: Hearing and external ear normal.     Left Ear: Hearing and external ear normal.     Nose: Nose normal.     Mouth/Throat:     Lips: Pink.  Eyes:     General: Lids are normal. Vision grossly intact. Gaze aligned appropriately.     Extraocular Movements: Extraocular movements intact.     Conjunctiva/sclera: Conjunctivae normal.  Pulmonary:     Effort: Pulmonary effort is normal.  Abdominal:     General: Bowel sounds are normal.     Palpations: Abdomen is soft.     Tenderness: There is no abdominal tenderness. There is no right CVA tenderness, left CVA tenderness or guarding.  Musculoskeletal:     Cervical back: Neck supple.  Skin:    General: Skin is warm and dry.     Capillary Refill: Capillary refill takes less than 2 seconds.     Findings: No rash.  Neurological:     General: No focal deficit present.     Mental Status: She is alert and oriented to person, place, and time. Mental status is at baseline.     Cranial Nerves: No dysarthria or facial asymmetry.  Psychiatric:        Mood and Affect: Mood normal.        Speech: Speech normal.        Behavior: Behavior normal.        Thought Content: Thought content normal.        Judgment: Judgment normal.      UC Treatments / Results  Labs (all labs ordered are listed, but only abnormal results are displayed) Labs Reviewed  POCT URINALYSIS DIP (MANUAL ENTRY) - Abnormal; Notable for the following components:      Result Value   Clarity, UA cloudy (*)    Blood, UA trace-intact (*)    Leukocytes, UA Small (1+) (*)    All other components within normal limits  URINE CULTURE  POCT URINE PREGNANCY    EKG   Radiology No results found.  Procedures Procedures (  including critical care time)  Medications Ordered in UC Medications - No data to display  Initial Impression  / Assessment and Plan / UC Course  I have reviewed the triage vital signs and the nursing notes.  Pertinent labs & imaging results that were available during my care of the patient were reviewed by me and considered in my medical decision making (see chart for details).   1.  Acute cystitis with hematuria Evaluation suggests acute cystitis based on presentation and urinalysis findings in clinic.  Urine culture pending.  Low suspicion for acute pyelonephritis, kidney stone or infected stone.  Keflex antibiotic ordered. Appears well hydrated, therefore will defer labs/imaging.  Vitals stable.  Patient to push fluids to stay well hydrated and reduce intake of known urinary irritants.  Discussed methods of preventing future UTI.   Counseled patient on potential for adverse effects with medications prescribed/recommended today, strict ER and return-to-clinic precautions discussed, patient verbalized understanding.    Final Clinical Impressions(s) / UC Diagnoses   Final diagnoses:  Acute cystitis with hematuria     Discharge Instructions      Your urine shows you likely have a urinary tract infection.   I have sent your urine for culture to confirm this.   We will call you if we need to change your antibiotic when we find out the type of bacteria growing in your bladder.  Take antibiotic as directed with a snack/food to avoid stomach upset. To avoid GI upset please take this medication with food.   Avoid drinking beverages that irritate the urinary tract like sodas, tea, coffee, or juice. Drink plenty of water to stay well hydrated and prevent severe infection.  If you develop pain in your upper back on one side, fever despite taking antibiotic, nausea and vomiting where you cannot keep anything down for 24 hours, dizziness/severe headache, or decreased urinary output, please go to the ER. Follow-up with PCP.       ED Prescriptions     Medication Sig Dispense Auth. Provider    cephALEXin (KEFLEX) 500 MG capsule Take 1 capsule (500 mg total) by mouth 2 (two) times daily for 7 days. 14 capsule Enedelia Dorna HERO, FNP      PDMP not reviewed this encounter.   Enedelia Dorna HERO, [place] 09/30/24 1240

## 2024-09-30 NOTE — Discharge Instructions (Signed)
 Your urine shows you likely have a urinary tract infection.   I have sent your urine for culture to confirm this.   We will call you if we need to change your antibiotic when we find out the type of bacteria growing in your bladder.  Take antibiotic as directed with a snack/food to avoid stomach upset. To avoid GI upset please take this medication with food.   Avoid drinking beverages that irritate the urinary tract like sodas, tea, coffee, or juice. Drink plenty of water  to stay well hydrated and prevent severe infection.  If you develop pain in your upper back on one side, fever despite taking antibiotic, nausea and vomiting where you cannot keep anything down for 24 hours, dizziness/severe headache, or decreased urinary output, please go to the ER. Follow-up with PCP.

## 2024-09-30 NOTE — ED Triage Notes (Signed)
 PT reports having dysuria and pressure  since last Sunday. Pt reports she has a UTI. Pt tried OTC for urinary infection with out relief.

## 2024-10-02 ENCOUNTER — Ambulatory Visit (HOSPITAL_COMMUNITY): Payer: Self-pay

## 2024-10-02 LAB — URINE CULTURE: Culture: >=100000 — AB
